# Patient Record
Sex: Male | Born: 1995 | Race: Black or African American | Hispanic: No | Marital: Single | Smoking: Former smoker
Health system: Southern US, Community
[De-identification: ages and names within clinical notes are randomized; demographics above are authoritative.]

## PROBLEM LIST (undated history)

## (undated) DIAGNOSIS — M419 Scoliosis, unspecified: Secondary | ICD-10-CM

## (undated) HISTORY — PX: BACK SURGERY: SHX140

---
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
## 2015-04-22 ENCOUNTER — Encounter (HOSPITAL_COMMUNITY): Payer: Self-pay | Admitting: *Deleted

## 2015-04-22 ENCOUNTER — Emergency Department (HOSPITAL_COMMUNITY)
Admission: EM | Admit: 2015-04-22 | Discharge: 2015-04-23 | Disposition: A | Discharge status: Other Acute Inpatient Hospital | Payer: Managed Care, Other (non HMO) | Attending: Emergency Medicine | Admitting: Emergency Medicine

## 2015-04-22 DIAGNOSIS — Z87891 Personal history of nicotine dependence: Secondary | ICD-10-CM | POA: Diagnosis not present

## 2015-04-22 DIAGNOSIS — Z8739 Personal history of other diseases of the musculoskeletal system and connective tissue: Secondary | ICD-10-CM | POA: Diagnosis not present

## 2015-04-22 DIAGNOSIS — R45851 Suicidal ideations: Secondary | ICD-10-CM

## 2015-04-22 DIAGNOSIS — F1210 Cannabis abuse, uncomplicated: Secondary | ICD-10-CM | POA: Insufficient documentation

## 2015-04-22 DIAGNOSIS — F32A Depression, unspecified: Secondary | ICD-10-CM

## 2015-04-22 DIAGNOSIS — F329 Major depressive disorder, single episode, unspecified: Secondary | ICD-10-CM | POA: Diagnosis not present

## 2015-04-22 LAB — CBC
HCT: 45.7 % (ref 39.0–52.0)
Hemoglobin: 15.6 g/dL (ref 13.0–17.0)
MCH: 29.7 pg (ref 26.0–34.0)
MCHC: 34.1 g/dL (ref 30.0–36.0)
MCV: 87 fL (ref 78.0–100.0)
PLATELETS: 145 10*3/uL — AB (ref 150–400)
RBC: 5.25 MIL/uL (ref 4.22–5.81)
RDW: 11.9 % (ref 11.5–15.5)
WBC: 2.6 10*3/uL — AB (ref 4.0–10.5)

## 2015-04-22 LAB — COMPREHENSIVE METABOLIC PANEL
ALBUMIN: 4.5 g/dL (ref 3.5–5.0)
ALK PHOS: 88 U/L (ref 38–126)
ALT: 17 U/L (ref 17–63)
AST: 26 U/L (ref 15–41)
Anion gap: 15 (ref 5–15)
BILIRUBIN TOTAL: 0.8 mg/dL (ref 0.3–1.2)
BUN: 15 mg/dL (ref 6–20)
CO2: 25 mmol/L (ref 22–32)
Calcium: 10.1 mg/dL (ref 8.9–10.3)
Chloride: 101 mmol/L (ref 101–111)
Creatinine, Ser: 1.15 mg/dL (ref 0.61–1.24)
GFR calc non Af Amer: >60 mL/min (ref >60)
Glucose, Bld: 85 mg/dL (ref 65–99)
Potassium: 3.8 mmol/L (ref 3.5–5.1)
SODIUM: 141 mmol/L (ref 135–145)
Total Protein: 8.4 g/dL — ABNORMAL HIGH (ref 6.5–8.1)

## 2015-04-22 LAB — RAPID URINE DRUG SCREEN, HOSP PERFORMED
AMPHETAMINES: NOT DETECTED
BENZODIAZEPINES: NOT DETECTED
Barbiturates: NOT DETECTED
COCAINE: NOT DETECTED
OPIATES: NOT DETECTED
Tetrahydrocannabinol: POSITIVE — AB

## 2015-04-22 LAB — ACETAMINOPHEN LEVEL

## 2015-04-22 LAB — SALICYLATE LEVEL

## 2015-04-22 LAB — ETHANOL
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
## 2015-04-22 NOTE — ED Notes (Signed)
Pt states that he has felt suicidal for several months. Denies plan. Reports hx with anxiety but does not take any meds. States he feels very nervous.

## 2015-04-22 NOTE — ED Provider Notes (Signed)
CSN: [id number]     Arrival date & time 04/22/15  1933 History   First MD Initiated Contact with Patient 04/22/15 2128     Chief Complaint  Patient presents with  . Suicidal   HPI Pt presented to the ED with complaints of depression and suicidal ideation.  Pt has been having trouble with depression for several months.  He has had several life stressors including losing his job.  He denies any drug or alcohol use.  He has been having thoughts of suicide although no specific plan.  Past Medical History  Diagnosis Date  . Scoliosis    Past Surgical History  Procedure Laterality Date  . Back surgery     No family history on file. Social History  Substance Use Topics  . Smoking status: Former Games developer  . Smokeless tobacco: Never Used  . Alcohol Use: No    Review of Systems  All other systems reviewed and are negative.     Allergies  Review of patient's allergies indicates no known allergies.  Home Medications   Prior to Admission medications   Not on File   BP 145/97 mmHg  Pulse 67  Temp(Src) 99 F (37.2 C) (Oral)  Resp 18  SpO2 98% Physical Exam  Constitutional: He appears well-developed and well-nourished. No distress.  HENT:  Head: Normocephalic and atraumatic.  Right Ear: External ear normal.  Left Ear: External ear normal.  Eyes: Conjunctivae are normal. Right eye exhibits no discharge. Left eye exhibits no discharge. No scleral icterus.  Neck: Neck supple. No tracheal deviation present.  Cardiovascular: Normal rate, regular rhythm and intact distal pulses.   Pulmonary/Chest: Effort normal and breath sounds normal. No stridor. No respiratory distress. He has no wheezes. He has no rales.  Abdominal: Soft. Bowel sounds are normal. He exhibits no distension. There is no tenderness. There is no rebound and no guarding.  Musculoskeletal: He exhibits no edema or tenderness.  Neurological: He is alert. He has normal strength. No cranial nerve deficit (no facial droop,  extraocular movements intact, no slurred speech) or sensory deficit. He exhibits normal muscle tone. He displays no seizure activity. Coordination normal.  Skin: Skin is warm and dry. No rash noted.  Psychiatric: He has a normal mood and affect.  Nursing note and vitals reviewed.   ED Course  Procedures (including critical care time) Labs Review Labs Reviewed  COMPREHENSIVE METABOLIC PANEL - Abnormal; Notable for the following:    Total Protein 8.4 (*)    All other components within normal limits  ACETAMINOPHEN LEVEL - Abnormal; Notable for the following:    Acetaminophen (Tylenol), Serum <10 (*)    All other components within normal limits  CBC - Abnormal; Notable for the following:    WBC 2.6 (*)    Platelets 145 (*)    All other components within normal limits  URINE RAPID DRUG SCREEN, HOSP PERFORMED - Abnormal; Notable for the following:    Tetrahydrocannabinol POSITIVE (*)    All other components within normal limits  ETHANOL  SALICYLATE LEVEL     MDM   Final diagnoses:  Suicidal ideation  Depression    Pt labs have been reviewed.  No concerning findings.  Pt is medically stable.   Will consult with psychiatry.    Linwood Dibbles, MD 04/22/15 [phone]

## 2015-04-23 ENCOUNTER — Encounter (HOSPITAL_COMMUNITY): Payer: Self-pay | Admitting: *Deleted

## 2015-04-23 ENCOUNTER — Observation Stay (HOSPITAL_COMMUNITY)
Admission: AD | Admit: 2015-04-23 | Discharge: 2015-04-23 | Disposition: A | Discharge status: Home / Self Care | Payer: Managed Care, Other (non HMO) | Source: Intra-hospital | Attending: Psychiatry | Admitting: Psychiatry

## 2015-04-23 DIAGNOSIS — M419 Scoliosis, unspecified: Secondary | ICD-10-CM | POA: Diagnosis not present

## 2015-04-23 DIAGNOSIS — F4325 Adjustment disorder with mixed disturbance of emotions and conduct: Principal | ICD-10-CM | POA: Insufficient documentation

## 2015-04-23 DIAGNOSIS — Z87891 Personal history of nicotine dependence: Secondary | ICD-10-CM | POA: Insufficient documentation

## 2015-04-23 DIAGNOSIS — R45851 Suicidal ideations: Secondary | ICD-10-CM | POA: Insufficient documentation

## 2015-04-23 DIAGNOSIS — F1290 Cannabis use, unspecified, uncomplicated: Secondary | ICD-10-CM | POA: Insufficient documentation

## 2015-04-23 DIAGNOSIS — F329 Major depressive disorder, single episode, unspecified: Secondary | ICD-10-CM | POA: Diagnosis present

## 2015-04-23 MED ORDER — MAGNESIUM HYDROXIDE 400 MG/5ML PO SUSP: 30.0000 mL | Freq: Every day | ORAL | Status: DC | PRN

## 2015-04-23 MED ORDER — ALUM & MAG HYDROXIDE-SIMETH 200-200-20 MG/5ML PO SUSP: 30.0000 mL | ORAL | Status: DC | PRN

## 2015-04-23 MED ORDER — HYDROXYZINE HCL 25 MG PO TABS: 25.0000 mg | ORAL_TABLET | Freq: Three times a day (TID) | ORAL | Status: DC | PRN

## 2015-04-23 MED ORDER — NICOTINE 21 MG/24HR TD PT24: 21.0000 mg | MEDICATED_PATCH | Freq: Every day | TRANSDERMAL | Status: DC | PRN

## 2015-04-23 MED ORDER — ACETAMINOPHEN 325 MG PO TABS: 650.0000 mg | ORAL_TABLET | Freq: Four times a day (QID) | ORAL | Status: DC | PRN

## 2015-04-23 NOTE — Consult Note (Deleted)
[facility] OBS UNIT H&P  Patient Identification: Troy Vasquez MRN:  [id number] Principal Diagnosis: Adjustment disorder with mixed disturbance of emotions and conduct Diagnosis:   Patient Active Problem List   Diagnosis Date Noted  . Adjustment disorder with mixed disturbance of emotions and conduct [F43.25] 04/23/2015    Total Time spent with patient: 45 minutes  Subjective:   Troy Vasquez is a 20 y.o. male patient admitted with reports of fleeting suicidal ideations over the past few days without plan or intent. Pt seen and chart reviewed. Pt is alert/oriented x4, calm, cooperative, and appropriate to situation. Pt denies suicidal/homicidal ideation and psychosis and does not appear to be responding to internal stimuli. Pt reports that he has no psych history and lives with his mother with whom he gets along well. Pt would like to discharge and follow-up with counseling services in the morning.   HPI:   Troy Vasquez is an 20 y.o. male. Pt states that prior to ED arrival, he was thinking about "everything" going on with him and "started freaking out"; Such as recently losing two jobs "back to back", current financial difficulties, and "things that happened to me as a child". Pt described "freaking out" as his emotions being "out of control". Pt stated that he kept crying and alternated between tearfulness and anger. Pt denies any h/o abuse or trauma. When assessing for intent pt responded "No not me personally but, I don't know like what would drive me to". Pt reports increased tearfulness, isolation/withdrawal, loss of interest, feelings of worthlessness/hopelessness and increased irritability. Pt reports poor appetite and decreased sleep, typically receiving 3hrs/night. Pt reports no h/o suicide attempts, aggression, HI/thoughts of harm, OPT, inpatient admission or self-injurious behaviors. Pt reports no h/o hallucinations. Pt was fully oriented. Pt presented as cooperative yet guarded.    Past Psychiatric History: None  Risk to Self:   Risk to Others:   Prior Inpatient Therapy:   Prior Outpatient Therapy:    Past Medical History:  Past Medical History  Diagnosis Date  . Scoliosis     Past Surgical History  Procedure Laterality Date  . Back surgery     Family History: History reviewed. No pertinent family history. Family Psychiatric  History: None Social History:  History  Alcohol Use No     History  Drug Use  . Yes  . Special: Marijuana    Comment: once/day    Social History   Social History  . Marital Status: Single    Spouse Name: N/A  . Number of Children: N/A  . Years of Education: N/A   Social History Main Topics  . Smoking status: Former Research scientist (life sciences)  . Smokeless tobacco: Never Used  . Alcohol Use: No  . Drug Use: Yes    Special: Marijuana     Comment: once/day  . Sexual Activity: Not Asked   Other Topics Concern  . None   Social History Narrative   Additional Social History:    Allergies:  No Known Allergies  Labs:  Results for orders placed or performed during the hospital encounter of 04/22/15 (from the past 48 hour(s))  Comprehensive metabolic panel     Status: Abnormal   Collection Time: 04/22/15  7:48 PM  Result Value Ref Range   Sodium 141 135 - 145 mmol/L   Potassium 3.8 3.5 - 5.1 mmol/L   Chloride 101 101 - 111 mmol/L   CO2 25 22 - 32 mmol/L   Glucose, Bld 85 65 - 99 mg/dL   BUN 15  6 - 20 mg/dL   Creatinine, Ser 1.15 0.61 - 1.24 mg/dL   Calcium 10.1 8.9 - 10.3 mg/dL   Total Protein 8.4 (H) 6.5 - 8.1 g/dL   Albumin 4.5 3.5 - 5.0 g/dL   AST 26 15 - 41 U/L   ALT 17 17 - 63 U/L   Alkaline Phosphatase 88 38 - 126 U/L   Total Bilirubin 0.8 0.3 - 1.2 mg/dL   GFR calc non Af Amer >60 >60 mL/min   GFR calc Af Amer >60 >60 mL/min    Comment: (NOTE) The eGFR has been calculated using the CKD EPI equation. This calculation has not been validated in all clinical situations. eGFR's persistently <60 mL/min signify possible  Chronic Kidney Disease.    Anion gap 15 5 - 15  Ethanol (ETOH)     Status: None   Collection Time: 04/22/15  7:48 PM  Result Value Ref Range   Alcohol, Ethyl (B) <5 <5 mg/dL    Comment:        LOWEST DETECTABLE LIMIT FOR SERUM ALCOHOL IS 5 mg/dL FOR MEDICAL PURPOSES ONLY   Salicylate level     Status: None   Collection Time: 04/22/15  7:48 PM  Result Value Ref Range   Salicylate Lvl <0.0 2.8 - 30.0 mg/dL  Acetaminophen level     Status: Abnormal   Collection Time: 04/22/15  7:48 PM  Result Value Ref Range   Acetaminophen (Tylenol), Serum <10 (L) 10 - 30 ug/mL    Comment:        THERAPEUTIC CONCENTRATIONS VARY SIGNIFICANTLY. A RANGE OF 10-30 ug/mL MAY BE AN EFFECTIVE CONCENTRATION FOR MANY PATIENTS. HOWEVER, SOME ARE BEST TREATED AT CONCENTRATIONS OUTSIDE THIS RANGE. ACETAMINOPHEN CONCENTRATIONS >150 ug/mL AT 4 HOURS AFTER INGESTION AND >50 ug/mL AT 12 HOURS AFTER INGESTION ARE OFTEN ASSOCIATED WITH TOXIC REACTIONS.   CBC     Status: Abnormal   Collection Time: 04/22/15  7:48 PM  Result Value Ref Range   WBC 2.6 (L) 4.0 - 10.5 K/uL   RBC 5.25 4.22 - 5.81 MIL/uL   Hemoglobin 15.6 13.0 - 17.0 g/dL   HCT 45.7 39.0 - 52.0 %   MCV 87.0 78.0 - 100.0 fL   MCH 29.7 26.0 - 34.0 pg   MCHC 34.1 30.0 - 36.0 g/dL   RDW 11.9 11.5 - 15.5 %   Platelets 145 (L) 150 - 400 K/uL  Urine rapid drug screen (hosp performed) (Not at [facility])     Status: Abnormal   Collection Time: 04/22/15  7:48 PM  Result Value Ref Range   Opiates NONE DETECTED NONE DETECTED   Cocaine NONE DETECTED NONE DETECTED   Benzodiazepines NONE DETECTED NONE DETECTED   Amphetamines NONE DETECTED NONE DETECTED   Tetrahydrocannabinol POSITIVE (A) NONE DETECTED   Barbiturates NONE DETECTED NONE DETECTED    Comment:        DRUG SCREEN FOR MEDICAL PURPOSES ONLY.  IF CONFIRMATION IS NEEDED FOR ANY PURPOSE, NOTIFY LAB WITHIN 5 DAYS.        LOWEST DETECTABLE LIMITS FOR URINE DRUG SCREEN Drug Class       Cutoff  (ng/mL) Amphetamine      1000 Barbiturate      200 Benzodiazepine   174 Tricyclics       944 Opiates          300 Cocaine          300 THC  50     No current facility-administered medications for this encounter.    Musculoskeletal: Strength & Muscle Tone: within normal limits Gait & Station: normal Patient leans: N/A  Psychiatric Specialty Exam: Review of Systems  Psychiatric/Behavioral: Positive for depression. Negative for substance abuse.  All other systems reviewed and are negative.   Blood pressure 110/68, pulse 66, temperature 98.4 F (36.9 C), temperature source Oral, resp. rate 16, height '5\' 9"'$  (1.753 m), weight 61.689 kg (136 lb), SpO2 100 %.Body mass index is 20.07 kg/(m^2).  General Appearance: Casual and Fairly Groomed  Engineer, water::  Good  Speech:  Clear and Coherent and Normal Rate  Volume:  Normal  Mood:  Anxious  Affect:  Appropriate and Congruent  Thought Process:  Coherent and Goal Directed  Orientation:  Full (Time, Place, and Person)  Thought Content:  concerns about followup  Suicidal Thoughts:  No  Homicidal Thoughts:  No  Memory:  Immediate;   Fair Recent;   Fair Remote;   Fair  Judgement:  Fair  Insight:  Fair  Psychomotor Activity:  Normal  Concentration:  Fair  Recall:  [employer] of Knowledge:Fair  Language: Fair  Akathisia:  No  Handed:    AIMS (if indicated):     Assets:  Communication Skills Desire for Improvement Physical Health Resilience Social Support  ADL's:  Intact  Cognition: WNL  Sleep:      Treatment Plan Summary: Adjustment disorder with mixed disturbance of emotions and conduct, improving, stable for discharge   Disposition: No evidence of imminent risk to self or others at present.   Patient does not meet criteria for psychiatric inpatient admission. Supportive therapy provided about ongoing stressors. Discussed crisis plan, support from social network, calling 911, coming to the Emergency  Department, and calling Suicide Hotline. Discharge home with outpatient resources given for psychiatry counseling services  Benjamine Mola, [place] 04/23/2015 3:02 PM

## 2015-04-23 NOTE — BH Assessment (Addendum)
Tele Assessment Note   Troy Vasquez is an 20 y.o. male. Pt states that prior to ED arrival, he was thinking about "everything" going on with him and "started freaking out"; Such as recently losing two jobs "back to back", current financial difficulties, and "things that happened to me as a child". Pt described "freaking out" as his emotions being "out of control". Pt stated that he kept crying and alternated between tearfulness and anger.  Pt denies any h/o abuse or trauma. When assessing for intent pt responded "No not me personally but, I don't know like what would drive me to".  Pt reports increased tearfulness, isolation/withdrawal, loss of interest, feelings of worthlessness/hopelessness and increased irritability. Pt reports poor appetite and decreased sleep, typically receiving 3hrs/night.   Pt reports no h/o suicide attempts, aggression, HI/thoughts of harm, OPT, inpatient admission or self-injurious behaviors. Pt reports no h/o hallucinations.   Pt was fully oriented. Pt presented as cooperative yet guarded.   Diagnosis: Unspecified Depressive D/O  Past Medical History:  Past Medical History  Diagnosis Date  . Scoliosis     Past Surgical History  Procedure Laterality Date  . Back surgery      Family History: No family history on file.  Social History:  reports that he has quit smoking. He has never used smokeless tobacco. He reports that he uses illicit drugs (Marijuana). He reports that he does not drink alcohol.  Additional Social History:  Alcohol / Drug Use Pain Medications: None Reported  Prescriptions: None Reported  Over the Counter: None Reported  History of alcohol / drug use?: No history of alcohol / drug abuse  CIWA: CIWA-Ar BP: 122/74 mmHg Pulse Rate: 86 COWS:    PATIENT STRENGTHS: (choose at least two) Average or above average intelligence Communication skills Physical Health  Allergies: No Known Allergies  Home Medications:  (Not in a  hospital admission)  OB/GYN Status:  No LMP for male patient.  General Assessment Data Location of Assessment: [facility] ED TTS Assessment: In system Is this a Tele or Face-to-Face Assessment?: Face-to-Face Is this an Initial Assessment or a Re-assessment for this encounter?: Initial Assessment Marital status: Single Is patient pregnant?: No Pregnancy Status: No Living Arrangements: Parent Can pt return to current living arrangement?: Yes Admission Status: Voluntary Is patient capable of signing voluntary admission?: Yes Referral Source: Self/Family/Friend Insurance type: None     Crisis Care Plan Living Arrangements: Parent Name of Psychiatrist: None Name of Therapist: None  Education Status Is patient currently in school?: No Highest grade of school patient has completed: 12th  Risk to self with the past 6 months Suicidal Ideation: Yes-Currently Present Has patient been a risk to self within the past 6 months prior to admission? : No Suicidal Intent:  ("No not me personally but, I don't know like what would driv) Has patient had any suicidal intent within the past 6 months prior to admission? : No Is patient at risk for suicide?: Yes Suicidal Plan?: No Has patient had any suicidal plan within the past 6 months prior to admission? : No What has been your use of drugs/alcohol within the last 12 months?: Pt denies Previous Attempts/Gestures: No Other Self Harm Risks: None Reported Intentional Self Injurious Behavior: None Family Suicide History: No Recent stressful life event(s): Financial Problems, Other (Comment) (Job loss, "things that happened to me as a child. ) Depression: Yes Depression Symptoms: Tearfulness, Feeling angry/irritable, Loss of interest in usual pleasures, Feeling worthless/self pity, Isolating Substance abuse history and/or treatment for  substance abuse?: No Suicide prevention information given to non-admitted patients: Not applicable  Risk to Others  within the past 6 months Homicidal Ideation: No Does patient have any lifetime risk of violence toward others beyond the six months prior to admission? : No Thoughts of Harm to Others: No Current Homicidal Plan: No Access to Homicidal Means: No History of harm to others?: No Assessment of Violence: None Noted Does patient have access to weapons?: No Criminal Charges Pending?: No Does patient have a court date: No Is patient on probation?: No  Psychosis Hallucinations: None noted Delusions: None noted  Mental Status Report Appearance/Hygiene: In scrubs Eye Contact: Good Motor Activity: Unremarkable Speech: Logical/coherent Level of Consciousness: Quiet/awake Mood: Depressed Affect: Depressed Anxiety Level: None Thought Processes: Coherent, Relevant Judgement: Unimpaired Orientation: Person, Place, Time, Situation, Appropriate for developmental age Obsessive Compulsive Thoughts/Behaviors: None  Cognitive Functioning Concentration: Normal Memory: Recent Intact, Remote Intact IQ: Average Insight: Fair Impulse Control: Good Appetite: Poor Weight Loss: 4.5 Weight Gain: 0 Sleep: Decreased Total Hours of Sleep: 3 Vegetative Symptoms: Staying in bed  ADLScreening [facility] Assessment Services) Patient's cognitive ability adequate to safely complete daily activities?: Yes Patient able to express need for assistance with ADLs?: Yes Independently performs ADLs?: Yes (appropriate for developmental age)  Prior Inpatient Therapy Prior Inpatient Therapy: No  Prior Outpatient Therapy Prior Outpatient Therapy: No Does patient have an ACCT team?: No Does patient have Intensive In-House Services?  : No Does patient have [facility] services? : No Does patient have P4CC services?: No  ADL Screening (condition at time of admission) Patient's cognitive ability adequate to safely complete daily activities?: Yes Is the patient deaf or have difficulty hearing?: Yes (reports difficulty  hearing out of right ear at times) Does the patient have difficulty seeing, even when wearing glasses/contacts?: No Does the patient have difficulty concentrating, remembering, or making decisions?: Yes Patient able to express need for assistance with ADLs?: Yes Does the patient have difficulty dressing or bathing?: No Independently performs ADLs?: Yes (appropriate for developmental age) Does the patient have difficulty walking or climbing stairs?: No Weakness of Legs: None Weakness of Arms/Hands: None  Home Assistive Devices/Equipment Home Assistive Devices/Equipment: None  Therapy Consults (therapy consults require a physician order) PT Evaluation Needed: No OT Evalulation Needed: No SLP Evaluation Needed: No Abuse/Neglect Assessment (Assessment to be complete while patient is alone) Physical Abuse: Denies Verbal Abuse: Yes, past (Comment) Sexual Abuse: Denies Exploitation of patient/patient's resources: Denies Self-Neglect: Denies Values / Beliefs Cultural Requests During Hospitalization: None Spiritual Requests During Hospitalization: None Consults Spiritual Care Consult Needed: No Social Work Consult Needed: No Merchant navy officer (For Healthcare) Does patient have an advance directive?: No Would patient like information on creating an advanced directive?: Yes English as a second language teacher given    Additional Information 1:1 In Past 12 Months?: No CIRT Risk: No Elopement Risk: No Does patient have medical clearance?: Yes     Disposition: Per Donell Sievert, PA pt meets criteria for inpatient admission. Clinician verified lack of bed availability at [facility]. Clinician confirmed [facility] bed availability with [facility] Unit Charge RN, [place] and pt referral has been made. Bobby (pt RN) and EDP Dr.Delo have been informed of pt disposition.  Disposition Initial Assessment Completed for this Encounter: Yes Disposition of Patient: Inpatient treatment program Type of inpatient treatment  program: Adult  Rayel Santizo J [place] 04/23/2015 2:25 AM

## 2015-04-23 NOTE — Discharge Summary (Signed)
[facility] OBS UNIT DISCHARGE SUMMARY (pt seen once due to short LOS, does not want to stay in the [place] and feels he did not truly understand why he was here when he wants outpatient; no psych history)  Patient Identification: Troy Vasquez MRN:  [id number] Principal Diagnosis: Adjustment disorder with mixed disturbance of emotions and conduct Diagnosis:   Patient Active Problem List   Diagnosis Date Noted  . Adjustment disorder with mixed disturbance of emotions and conduct [F43.25] 04/23/2015    Total Time spent with patient: 45 minutes  Subjective:   Troy Vasquez is a 20 y.o. male patient admitted with reports of fleeting suicidal ideations over the past few days without plan or intent. Pt seen and chart reviewed. Pt is alert/oriented x4, calm, cooperative, and appropriate to situation. Pt denies suicidal/homicidal ideation and psychosis and does not appear to be responding to internal stimuli. Pt reports that he has no psych history and lives with his mother with whom he gets along well. Pt would like to discharge and follow-up with counseling services in the morning.   HPI:   Troy Vasquez is an 20 y.o. male. Pt states that prior to ED arrival, he was thinking about "everything" going on with him and "started freaking out"; Such as recently losing two jobs "back to back", current financial difficulties, and "things that happened to me as a child". Pt described "freaking out" as his emotions being "out of control". Pt stated that he kept crying and alternated between tearfulness and anger. Pt denies any h/o abuse or trauma. When assessing for intent pt responded "No not me personally but, I don't know like what would drive me to". Pt reports increased tearfulness, isolation/withdrawal, loss of interest, feelings of worthlessness/hopelessness and increased irritability. Pt reports poor appetite and decreased sleep, typically receiving 3hrs/night. Pt reports no h/o suicide attempts,  aggression, HI/thoughts of harm, OPT, inpatient admission or self-injurious behaviors. Pt reports no h/o hallucinations. Pt was fully oriented. Pt presented as cooperative yet guarded.   Past Psychiatric History: None  Risk to Self:   Risk to Others:   Prior Inpatient Therapy:   Prior Outpatient Therapy:    Past Medical History:  Past Medical History  Diagnosis Date  . Scoliosis     Past Surgical History  Procedure Laterality Date  . Back surgery     Family History: History reviewed. No pertinent family history. Family Psychiatric  History: None Social History:  History  Alcohol Use No     History  Drug Use  . Yes  . Special: Marijuana    Comment: once/day    Social History   Social History  . Marital Status: Single    Spouse Name: N/A  . Number of Children: N/A  . Years of Education: N/A   Social History Main Topics  . Smoking status: Former Research scientist (life sciences)  . Smokeless tobacco: Never Used  . Alcohol Use: No  . Drug Use: Yes    Special: Marijuana     Comment: once/day  . Sexual Activity: Not Asked   Other Topics Concern  . None   Social History Narrative   Additional Social History:    Allergies:  No Known Allergies  Labs:  Results for orders placed or performed during the hospital encounter of 04/22/15 (from the past 48 hour(s))  Comprehensive metabolic panel     Status: Abnormal   Collection Time: 04/22/15  7:48 PM  Result Value Ref Range   Sodium 141 135 - 145 mmol/L  Potassium 3.8 3.5 - 5.1 mmol/L   Chloride 101 101 - 111 mmol/L   CO2 25 22 - 32 mmol/L   Glucose, Bld 85 65 - 99 mg/dL   BUN 15 6 - 20 mg/dL   Creatinine, Ser 1.15 0.61 - 1.24 mg/dL   Calcium 10.1 8.9 - 10.3 mg/dL   Total Protein 8.4 (H) 6.5 - 8.1 g/dL   Albumin 4.5 3.5 - 5.0 g/dL   AST 26 15 - 41 U/L   ALT 17 17 - 63 U/L   Alkaline Phosphatase 88 38 - 126 U/L   Total Bilirubin 0.8 0.3 - 1.2 mg/dL   GFR calc non Af Amer >60 >60 mL/min   GFR calc Af Amer >60 >60 mL/min    Comment:  (NOTE) The eGFR has been calculated using the CKD EPI equation. This calculation has not been validated in all clinical situations. eGFR's persistently <60 mL/min signify possible Chronic Kidney Disease.    Anion gap 15 5 - 15  Ethanol (ETOH)     Status: None   Collection Time: 04/22/15  7:48 PM  Result Value Ref Range   Alcohol, Ethyl (B) <5 <5 mg/dL    Comment:        LOWEST DETECTABLE LIMIT FOR SERUM ALCOHOL IS 5 mg/dL FOR MEDICAL PURPOSES ONLY   Salicylate level     Status: None   Collection Time: 04/22/15  7:48 PM  Result Value Ref Range   Salicylate Lvl <3.3 2.8 - 30.0 mg/dL  Acetaminophen level     Status: Abnormal   Collection Time: 04/22/15  7:48 PM  Result Value Ref Range   Acetaminophen (Tylenol), Serum <10 (L) 10 - 30 ug/mL    Comment:        THERAPEUTIC CONCENTRATIONS VARY SIGNIFICANTLY. A RANGE OF 10-30 ug/mL MAY BE AN EFFECTIVE CONCENTRATION FOR MANY PATIENTS. HOWEVER, SOME ARE BEST TREATED AT CONCENTRATIONS OUTSIDE THIS RANGE. ACETAMINOPHEN CONCENTRATIONS >150 ug/mL AT 4 HOURS AFTER INGESTION AND >50 ug/mL AT 12 HOURS AFTER INGESTION ARE OFTEN ASSOCIATED WITH TOXIC REACTIONS.   CBC     Status: Abnormal   Collection Time: 04/22/15  7:48 PM  Result Value Ref Range   WBC 2.6 (L) 4.0 - 10.5 K/uL   RBC 5.25 4.22 - 5.81 MIL/uL   Hemoglobin 15.6 13.0 - 17.0 g/dL   HCT 45.7 39.0 - 52.0 %   MCV 87.0 78.0 - 100.0 fL   MCH 29.7 26.0 - 34.0 pg   MCHC 34.1 30.0 - 36.0 g/dL   RDW 11.9 11.5 - 15.5 %   Platelets 145 (L) 150 - 400 K/uL  Urine rapid drug screen (hosp performed) (Not at [facility])     Status: Abnormal   Collection Time: 04/22/15  7:48 PM  Result Value Ref Range   Opiates NONE DETECTED NONE DETECTED   Cocaine NONE DETECTED NONE DETECTED   Benzodiazepines NONE DETECTED NONE DETECTED   Amphetamines NONE DETECTED NONE DETECTED   Tetrahydrocannabinol POSITIVE (A) NONE DETECTED   Barbiturates NONE DETECTED NONE DETECTED    Comment:        DRUG SCREEN  FOR MEDICAL PURPOSES ONLY.  IF CONFIRMATION IS NEEDED FOR ANY PURPOSE, NOTIFY LAB WITHIN 5 DAYS.        LOWEST DETECTABLE LIMITS FOR URINE DRUG SCREEN Drug Class       Cutoff (ng/mL) Amphetamine      1000 Barbiturate      200 Benzodiazepine   007 Tricyclics       622 Opiates  300 Cocaine          300 THC              50     No current facility-administered medications for this encounter.    Musculoskeletal: Strength & Muscle Tone: within normal limits Gait & Station: normal Patient leans: N/A  Psychiatric Specialty Exam: Review of Systems  Psychiatric/Behavioral: Positive for depression. Negative for substance abuse.  All other systems reviewed and are negative.   Blood pressure 110/68, pulse 66, temperature 98.4 F (36.9 C), temperature source Oral, resp. rate 16, height 5' 9"  (1.753 m), weight 61.689 kg (136 lb), SpO2 100 %.Body mass index is 20.07 kg/(m^2).  General Appearance: Casual and Fairly Groomed  Engineer, water::  Good  Speech:  Clear and Coherent and Normal Rate  Volume:  Normal  Mood:  Anxious  Affect:  Appropriate and Congruent  Thought Process:  Coherent and Goal Directed  Orientation:  Full (Time, Place, and Person)  Thought Content:  concerns about followup  Suicidal Thoughts:  No  Homicidal Thoughts:  No  Memory:  Immediate;   Fair Recent;   Fair Remote;   Fair  Judgement:  Fair  Insight:  Fair  Psychomotor Activity:  Normal  Concentration:  Fair  Recall:  [employer] of Knowledge:Fair  Language: Fair  Akathisia:  No  Handed:    AIMS (if indicated):     Assets:  Communication Skills Desire for Improvement Physical Health Resilience Social Support  ADL's:  Intact  Cognition: WNL  Sleep:      Treatment Plan Summary: Adjustment disorder with mixed disturbance of emotions and conduct, improving, stable for discharge    Disposition: No evidence of imminent risk to self or others at present.   Patient does not meet criteria for  psychiatric inpatient admission. Supportive therapy provided about ongoing stressors. Discussed crisis plan, support from social network, calling 911, coming to the Emergency Department, and calling Suicide Hotline. Discharge home with outpatient resources given for psychiatry counseling services  Benjamine Mola, [place] 04/23/2015 3:02 PM  I agree with assessment and plan Geralyn Flash A. Sabra Heck, M.D.

## 2015-04-23 NOTE — ED Notes (Signed)
pts mother took home all pt belongings.

## 2015-04-23 NOTE — Progress Notes (Signed)
Patient ID: Troy Vasquez, male   DOB: 1996/01/03, 20 y.o.   MRN: [id number] Patient has all f/u resources given by counselor.  He denies SI/HI and his behavior is appropriate.  Verbalizes all f/u instructions.  No belongings to return to patient. Escorted to lobby to wait for family to pick up. NAD

## 2015-04-23 NOTE — Progress Notes (Signed)
Troy Vasquez is a 20 y/o S/M admitted to voluntarily to observation unit following c/o depression and passive SI thoughts.  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  He reports feelings of worthlessness,hoplessness and a decreased appetite with subsequent 5lb weight loss.  Denies physical problems or pain. States he has supportive parents and siblings.  Is requesting outside counseling.  No drug/etoh use.  No prescribed home meds.  Seasonal allergies only.  Oriented to obs unit and report to providers.  Meal given.

## 2015-04-23 NOTE — ED Notes (Signed)
Mother -- Andrei Mccook -- [phone] --- notified of patient's transfer.

## 2015-04-23 NOTE — Progress Notes (Signed)
Discussed pt's case with psych team. Dr. Lolly Mustache has accepted pt to [facility] observation unit bed 2. Report can be called at [phone].  Ilean Skill, MSW, LCSW Clinical Social Work, Disposition  04/23/2015 [phone]

## 2015-04-23 NOTE — ED Notes (Signed)
Patient was given a snack and drink. A regular diet ordered for lunch. 

## 2015-04-23 NOTE — ED Notes (Signed)
Pt provided with a [place] sandwich and sprite to drink.

## 2015-04-23 NOTE — H&P (Signed)
[facility] OBS UNIT H&P  Patient Identification: Troy Vasquez MRN:  [id number] Principal Diagnosis: Adjustment disorder with mixed disturbance of emotions and conduct Diagnosis:   Patient Active Problem List   Diagnosis Date Noted  . Adjustment disorder with mixed disturbance of emotions and conduct [F43.25] 04/23/2015    Total Time spent with patient: 45 minutes  Subjective:   Troy Vasquez is a 20 y.o. male patient admitted with reports of fleeting suicidal ideations over the past few days without plan or intent. Pt seen and chart reviewed. Pt is alert/oriented x4, calm, cooperative, and appropriate to situation. Pt denies suicidal/homicidal ideation and psychosis and does not appear to be responding to internal stimuli. Pt reports that he has no psych history and lives with his mother with whom he gets along well. Pt would like to discharge and follow-up with counseling services in the morning.   HPI:   Troy Vasquez is an 20 y.o. male. Pt states that prior to ED arrival, he was thinking about "everything" going on with him and "started freaking out"; Such as recently losing two jobs "back to back", current financial difficulties, and "things that happened to me as a child". Pt described "freaking out" as his emotions being "out of control". Pt stated that he kept crying and alternated between tearfulness and anger. Pt denies any h/o abuse or trauma. When assessing for intent pt responded "No not me personally but, I don't know like what would drive me to". Pt reports increased tearfulness, isolation/withdrawal, loss of interest, feelings of worthlessness/hopelessness and increased irritability. Pt reports poor appetite and decreased sleep, typically receiving 3hrs/night. Pt reports no h/o suicide attempts, aggression, HI/thoughts of harm, OPT, inpatient admission or self-injurious behaviors. Pt reports no h/o hallucinations. Pt was fully oriented. Pt presented as cooperative yet guarded.    Past Psychiatric History: None  Risk to Self:   Risk to Others:   Prior Inpatient Therapy:   Prior Outpatient Therapy:    Past Medical History:  Past Medical History  Diagnosis Date  . Scoliosis     Past Surgical History  Procedure Laterality Date  . Back surgery     Family History: History reviewed. No pertinent family history. Family Psychiatric  History: None Social History:  History  Alcohol Use No     History  Drug Use  . Yes  . Special: Marijuana    Comment: once/day    Social History   Social History  . Marital Status: Single    Spouse Name: N/A  . Number of Children: N/A  . Years of Education: N/A   Social History Main Topics  . Smoking status: Former Research scientist (life sciences)  . Smokeless tobacco: Never Used  . Alcohol Use: No  . Drug Use: Yes    Special: Marijuana     Comment: once/day  . Sexual Activity: Not Asked   Other Topics Concern  . None   Social History Narrative   Additional Social History:    Allergies:  No Known Allergies  Labs:  Results for orders placed or performed during the hospital encounter of 04/22/15 (from the past 48 hour(s))  Comprehensive metabolic panel     Status: Abnormal   Collection Time: 04/22/15  7:48 PM  Result Value Ref Range   Sodium 141 135 - 145 mmol/L   Potassium 3.8 3.5 - 5.1 mmol/L   Chloride 101 101 - 111 mmol/L   CO2 25 22 - 32 mmol/L   Glucose, Bld 85 65 - 99 mg/dL   BUN 15  6 - 20 mg/dL   Creatinine, Ser 1.15 0.61 - 1.24 mg/dL   Calcium 10.1 8.9 - 10.3 mg/dL   Total Protein 8.4 (H) 6.5 - 8.1 g/dL   Albumin 4.5 3.5 - 5.0 g/dL   AST 26 15 - 41 U/L   ALT 17 17 - 63 U/L   Alkaline Phosphatase 88 38 - 126 U/L   Total Bilirubin 0.8 0.3 - 1.2 mg/dL   GFR calc non Af Amer >60 >60 mL/min   GFR calc Af Amer >60 >60 mL/min    Comment: (NOTE) The eGFR has been calculated using the CKD EPI equation. This calculation has not been validated in all clinical situations. eGFR's persistently <60 mL/min signify possible  Chronic Kidney Disease.    Anion gap 15 5 - 15  Ethanol (ETOH)     Status: None   Collection Time: 04/22/15  7:48 PM  Result Value Ref Range   Alcohol, Ethyl (B) <5 <5 mg/dL    Comment:        LOWEST DETECTABLE LIMIT FOR SERUM ALCOHOL IS 5 mg/dL FOR MEDICAL PURPOSES ONLY   Salicylate level     Status: None   Collection Time: 04/22/15  7:48 PM  Result Value Ref Range   Salicylate Lvl <0.0 2.8 - 30.0 mg/dL  Acetaminophen level     Status: Abnormal   Collection Time: 04/22/15  7:48 PM  Result Value Ref Range   Acetaminophen (Tylenol), Serum <10 (L) 10 - 30 ug/mL    Comment:        THERAPEUTIC CONCENTRATIONS VARY SIGNIFICANTLY. A RANGE OF 10-30 ug/mL MAY BE AN EFFECTIVE CONCENTRATION FOR MANY PATIENTS. HOWEVER, SOME ARE BEST TREATED AT CONCENTRATIONS OUTSIDE THIS RANGE. ACETAMINOPHEN CONCENTRATIONS >150 ug/mL AT 4 HOURS AFTER INGESTION AND >50 ug/mL AT 12 HOURS AFTER INGESTION ARE OFTEN ASSOCIATED WITH TOXIC REACTIONS.   CBC     Status: Abnormal   Collection Time: 04/22/15  7:48 PM  Result Value Ref Range   WBC 2.6 (L) 4.0 - 10.5 K/uL   RBC 5.25 4.22 - 5.81 MIL/uL   Hemoglobin 15.6 13.0 - 17.0 g/dL   HCT 45.7 39.0 - 52.0 %   MCV 87.0 78.0 - 100.0 fL   MCH 29.7 26.0 - 34.0 pg   MCHC 34.1 30.0 - 36.0 g/dL   RDW 11.9 11.5 - 15.5 %   Platelets 145 (L) 150 - 400 K/uL  Urine rapid drug screen (hosp performed) (Not at [facility])     Status: Abnormal   Collection Time: 04/22/15  7:48 PM  Result Value Ref Range   Opiates NONE DETECTED NONE DETECTED   Cocaine NONE DETECTED NONE DETECTED   Benzodiazepines NONE DETECTED NONE DETECTED   Amphetamines NONE DETECTED NONE DETECTED   Tetrahydrocannabinol POSITIVE (A) NONE DETECTED   Barbiturates NONE DETECTED NONE DETECTED    Comment:        DRUG SCREEN FOR MEDICAL PURPOSES ONLY.  IF CONFIRMATION IS NEEDED FOR ANY PURPOSE, NOTIFY LAB WITHIN 5 DAYS.        LOWEST DETECTABLE LIMITS FOR URINE DRUG SCREEN Drug Class       Cutoff  (ng/mL) Amphetamine      1000 Barbiturate      200 Benzodiazepine   174 Tricyclics       944 Opiates          300 Cocaine          300 THC  50     No current facility-administered medications for this encounter.    Musculoskeletal: Strength & Muscle Tone: within normal limits Gait & Station: normal Patient leans: N/A  Psychiatric Specialty Exam: Review of Systems  Psychiatric/Behavioral: Positive for depression. Negative for substance abuse.  All other systems reviewed and are negative.   Blood pressure 110/68, pulse 66, temperature 98.4 F (36.9 C), temperature source Oral, resp. rate 16, height 5' 9" (1.753 m), weight 61.689 kg (136 lb), SpO2 100 %.Body mass index is 20.07 kg/(m^2).  General Appearance: Casual and Fairly Groomed  Engineer, water::  Good  Speech:  Clear and Coherent and Normal Rate  Volume:  Normal  Mood:  Anxious  Affect:  Appropriate and Congruent  Thought Process:  Coherent and Goal Directed  Orientation:  Full (Time, Place, and Person)  Thought Content:  concerns about followup  Suicidal Thoughts:  No  Homicidal Thoughts:  No  Memory:  Immediate;   Fair Recent;   Fair Remote;   Fair  Judgement:  Fair  Insight:  Fair  Psychomotor Activity:  Normal  Concentration:  Fair  Recall:  [employer] of Knowledge:Fair  Language: Fair  Akathisia:  No  Handed:    AIMS (if indicated):     Assets:  Communication Skills Desire for Improvement Physical Health Resilience Social Support  ADL's:  Intact  Cognition: WNL  Sleep:      Treatment Plan Summary: Adjustment disorder with mixed disturbance of emotions and conduct, improving, stable for discharge   Disposition: No evidence of imminent risk to self or others at present.   Patient does not meet criteria for psychiatric inpatient admission. Supportive therapy provided about ongoing stressors. Discussed crisis plan, support from social network, calling 911, coming to the Emergency  Department, and calling Suicide Hotline. Discharge home with outpatient resources given for psychiatry counseling services  Benjamine Mola, [place] 04/23/2015 3:02 PM  I agree with assessment and plan Geralyn Flash A. Sabra Heck, M.D.
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
## 2015-11-07 ENCOUNTER — Emergency Department (HOSPITAL_COMMUNITY): Payer: 59

## 2015-11-07 ENCOUNTER — Emergency Department (HOSPITAL_COMMUNITY)
Admission: EM | Admit: 2015-11-07 | Discharge: 2015-11-07 | Disposition: A | Discharge status: Home / Self Care | Payer: 59 | Attending: Emergency Medicine | Admitting: Emergency Medicine

## 2015-11-07 ENCOUNTER — Encounter (HOSPITAL_COMMUNITY): Payer: Self-pay | Admitting: Emergency Medicine

## 2015-11-07 DIAGNOSIS — S0990XA Unspecified injury of head, initial encounter: Secondary | ICD-10-CM | POA: Diagnosis present

## 2015-11-07 DIAGNOSIS — Y939 Activity, unspecified: Secondary | ICD-10-CM | POA: Insufficient documentation

## 2015-11-07 DIAGNOSIS — Z5181 Encounter for therapeutic drug level monitoring: Secondary | ICD-10-CM | POA: Diagnosis not present

## 2015-11-07 DIAGNOSIS — Y999 Unspecified external cause status: Secondary | ICD-10-CM | POA: Diagnosis not present

## 2015-11-07 DIAGNOSIS — W1830XA Fall on same level, unspecified, initial encounter: Secondary | ICD-10-CM | POA: Insufficient documentation

## 2015-11-07 DIAGNOSIS — S0001XA Abrasion of scalp, initial encounter: Secondary | ICD-10-CM | POA: Insufficient documentation

## 2015-11-07 DIAGNOSIS — F10120 Alcohol abuse with intoxication, uncomplicated: Secondary | ICD-10-CM | POA: Diagnosis not present

## 2015-11-07 DIAGNOSIS — Z23 Encounter for immunization: Secondary | ICD-10-CM | POA: Insufficient documentation

## 2015-11-07 DIAGNOSIS — Y9289 Other specified places as the place of occurrence of the external cause: Secondary | ICD-10-CM | POA: Insufficient documentation

## 2015-11-07 DIAGNOSIS — F10920 Alcohol use, unspecified with intoxication, uncomplicated: Secondary | ICD-10-CM

## 2015-11-07 DIAGNOSIS — Z87891 Personal history of nicotine dependence: Secondary | ICD-10-CM | POA: Insufficient documentation

## 2015-11-07 LAB — RAPID URINE DRUG SCREEN, HOSP PERFORMED
Amphetamines: NOT DETECTED
BARBITURATES: NOT DETECTED
Benzodiazepines: NOT DETECTED
COCAINE: NOT DETECTED
OPIATES: NOT DETECTED
Tetrahydrocannabinol: POSITIVE — AB

## 2015-11-07 LAB — CBC
HCT: 42.4 % (ref 39.0–52.0)
Hemoglobin: 13.9 g/dL (ref 13.0–17.0)
MCH: 29 pg (ref 26.0–34.0)
MCHC: 32.8 g/dL (ref 30.0–36.0)
MCV: 88.3 fL (ref 78.0–100.0)
PLATELETS: 173 10*3/uL (ref 150–400)
RBC: 4.8 MIL/uL (ref 4.22–5.81)
RDW: 12.2 % (ref 11.5–15.5)
WBC: 3.8 10*3/uL — AB (ref 4.0–10.5)

## 2015-11-07 LAB — BASIC METABOLIC PANEL
ANION GAP: 12 (ref 5–15)
BUN: 13 mg/dL (ref 6–20)
CALCIUM: 9.6 mg/dL (ref 8.9–10.3)
CO2: 22 mmol/L (ref 22–32)
CREATININE: 1.03 mg/dL (ref 0.61–1.24)
Chloride: 106 mmol/L (ref 101–111)
Glucose, Bld: 96 mg/dL (ref 65–99)
Potassium: 3.5 mmol/L (ref 3.5–5.1)
Sodium: 140 mmol/L (ref 135–145)

## 2015-11-07 LAB — ETHANOL: Alcohol, Ethyl (B): 192 mg/dL — ABNORMAL HIGH (ref <5)

## 2015-11-07 MED ORDER — TETANUS-DIPHTH-ACELL PERTUSSIS 5-2.5-18.5 LF-MCG/0.5 IM SUSP
0.5000 mL | Freq: Once | INTRAMUSCULAR | Status: AC
  Administered 2015-11-07: 0.5 mL via INTRAMUSCULAR
  Filled 2015-11-07: qty 0.5
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
## 2015-11-07 NOTE — Discharge Instructions (Signed)
Avoid excessive alcohol use. Follow up as needed. Tylenol or motrin for headache. Return if worsening symptoms.

## 2015-11-07 NOTE — ED Notes (Signed)
Patient not currently in room. Patient in CT at this moment.
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
## 2015-11-07 NOTE — ED Notes (Signed)
Pt came to registration desk and said he was leaving and walked out.  Brother spoke with RN and states pt is leaving.  Pt intoxicated. Strongly encouraged them to stay and informed them that pt would go to treatment room soon.  Brother talking with pt and pt has come back in to waiting room.  Encouraged to stay to be seen.

## 2015-11-07 NOTE — ED Notes (Signed)
Pt A&O x 4, VSS, NAD noted. Able to ambulate w/o difficulty.

## 2016-05-31 ENCOUNTER — Telehealth: Payer: Self-pay | Admitting: General Practice

## 2016-05-31 NOTE — Telephone Encounter (Signed)
Sorry but I am too full  

## 2016-05-31 NOTE — Telephone Encounter (Signed)
Pts mother would like to see if you would accept this pt since you see her other sons?

## 2016-05-31 NOTE — Telephone Encounter (Signed)
lmom for pts mother to give office a call back.

## 2016-06-01 NOTE — Telephone Encounter (Signed)
Mom is aware Dr Clent Ridges can not accept her son. She declined another provider at this location.

## 2020-03-24 ENCOUNTER — Emergency Department (HOSPITAL_COMMUNITY)
Admission: EM | Admit: 2020-03-24 | Discharge: 2020-03-24 | Disposition: A | Discharge status: Home / Self Care | Payer: 59 | Attending: Emergency Medicine | Admitting: Emergency Medicine

## 2020-03-24 ENCOUNTER — Emergency Department (HOSPITAL_COMMUNITY): Payer: 59

## 2020-03-24 ENCOUNTER — Other Ambulatory Visit: Payer: Self-pay

## 2020-03-24 ENCOUNTER — Encounter (HOSPITAL_COMMUNITY): Payer: Self-pay | Admitting: Emergency Medicine

## 2020-03-24 DIAGNOSIS — M542 Cervicalgia: Secondary | ICD-10-CM | POA: Diagnosis not present

## 2020-03-24 DIAGNOSIS — Y92410 Unspecified street and highway as the place of occurrence of the external cause: Secondary | ICD-10-CM | POA: Insufficient documentation

## 2020-03-24 DIAGNOSIS — R519 Headache, unspecified: Secondary | ICD-10-CM | POA: Diagnosis not present

## 2020-03-24 MED ORDER — IBUPROFEN 400 MG PO TABS
600.0000 mg | ORAL_TABLET | Freq: Once | ORAL | Status: AC
  Administered 2020-03-24: 600 mg via ORAL
  Filled 2020-03-24: qty 1

## 2020-03-24 NOTE — Discharge Instructions (Addendum)
Please return for problem.   Use ibuprofen (600mg  every 8 hours) for pain.
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
## 2020-03-24 NOTE — ED Notes (Signed)
Patient discharge instructions reviewed with the patient. The patient verbalized understanding of instructions. Patient discharged. 

## 2023-01-02 ENCOUNTER — Emergency Department (HOSPITAL_COMMUNITY): Payer: 59

## 2023-01-02 ENCOUNTER — Other Ambulatory Visit: Payer: Self-pay

## 2023-01-02 ENCOUNTER — Encounter (HOSPITAL_COMMUNITY): Payer: Self-pay

## 2023-01-02 ENCOUNTER — Emergency Department (HOSPITAL_COMMUNITY)
Admission: EM | Admit: 2023-01-02 | Discharge: 2023-01-02 | Disposition: A | Discharge status: Home / Self Care | Payer: 59 | Attending: Emergency Medicine | Admitting: Emergency Medicine

## 2023-01-02 DIAGNOSIS — M25562 Pain in left knee: Secondary | ICD-10-CM | POA: Diagnosis not present

## 2023-01-02 DIAGNOSIS — M25561 Pain in right knee: Secondary | ICD-10-CM | POA: Insufficient documentation

## 2023-01-02 DIAGNOSIS — Y92410 Unspecified street and highway as the place of occurrence of the external cause: Secondary | ICD-10-CM | POA: Insufficient documentation

## 2023-01-02 DIAGNOSIS — S0181XA Laceration without foreign body of other part of head, initial encounter: Secondary | ICD-10-CM | POA: Insufficient documentation

## 2023-01-02 LAB — BASIC METABOLIC PANEL
Anion gap: 15 (ref 5–15)
BUN: 11 mg/dL (ref 6–20)
CO2: 23 mmol/L (ref 22–32)
Calcium: 9.4 mg/dL (ref 8.9–10.3)
Chloride: 102 mmol/L (ref 98–111)
Creatinine, Ser: 1.04 mg/dL (ref 0.61–1.24)
GFR, Estimated: >60 mL/min (ref >60)
Glucose, Bld: 83 mg/dL (ref 70–99)
Potassium: 3.6 mmol/L (ref 3.5–5.1)
Sodium: 140 mmol/L (ref 135–145)

## 2023-01-02 LAB — CBC WITH DIFFERENTIAL/PLATELET
Abs Immature Granulocytes: 0.02 10*3/uL (ref 0.00–0.07)
Basophils Absolute: 0 10*3/uL (ref 0.0–0.1)
Basophils Relative: 0 %
Eosinophils Absolute: 0 10*3/uL (ref 0.0–0.5)
Eosinophils Relative: 1 %
HCT: 44.1 % (ref 39.0–52.0)
Hemoglobin: 14.5 g/dL (ref 13.0–17.0)
Immature Granulocytes: 1 %
Lymphocytes Relative: 41 %
Lymphs Abs: 1.3 10*3/uL (ref 0.7–4.0)
MCH: 29.3 pg (ref 26.0–34.0)
MCHC: 32.9 g/dL (ref 30.0–36.0)
MCV: 89.1 fL (ref 80.0–100.0)
Monocytes Absolute: 0.3 10*3/uL (ref 0.1–1.0)
Monocytes Relative: 9 %
Neutro Abs: 1.5 10*3/uL — ABNORMAL LOW (ref 1.7–7.7)
Neutrophils Relative %: 48 %
Platelets: 173 10*3/uL (ref 150–400)
RBC: 4.95 MIL/uL (ref 4.22–5.81)
RDW: 11.9 % (ref 11.5–15.5)
WBC: 3.1 10*3/uL — ABNORMAL LOW (ref 4.0–10.5)
nRBC: 0 % (ref 0.0–0.2)

## 2023-01-02 LAB — I-STAT CHEM 8, ED
BUN: 13 mg/dL (ref 6–20)
Calcium, Ion: 1.1 mmol/L — ABNORMAL LOW (ref 1.15–1.40)
Chloride: 105 mmol/L (ref 98–111)
Creatinine, Ser: 1.2 mg/dL (ref 0.61–1.24)
Glucose, Bld: 82 mg/dL (ref 70–99)
HCT: 47 % (ref 39.0–52.0)
Hemoglobin: 16 g/dL (ref 13.0–17.0)
Potassium: 3.6 mmol/L (ref 3.5–5.1)
Sodium: 142 mmol/L (ref 135–145)
TCO2: 23 mmol/L (ref 22–32)

## 2023-01-02 LAB — ETHANOL: Alcohol, Ethyl (B): 150 mg/dL — ABNORMAL HIGH (ref <10)

## 2023-01-02 MED ORDER — MECLIZINE HCL 25 MG PO TABS: 25.0000 mg | ORAL_TABLET | Freq: Three times a day (TID) | ORAL | 0 refills | Status: DC | PRN

## 2023-01-02 MED ORDER — CELECOXIB 200 MG PO CAPS: 200.0000 mg | ORAL_CAPSULE | Freq: Two times a day (BID) | ORAL | 0 refills | Status: DC

## 2023-01-02 MED ORDER — CYCLOBENZAPRINE HCL 10 MG PO TABS: 5.0000 mg | ORAL_TABLET | Freq: Two times a day (BID) | ORAL | 0 refills | Status: DC | PRN

## 2023-01-02 MED ORDER — LIDOCAINE-EPINEPHRINE (PF) 2 %-1:200000 IJ SOLN
20.0000 mL | Freq: Once | INTRAMUSCULAR | Status: AC
  Administered 2023-01-02: 20 mL
  Filled 2023-01-02: qty 20

## 2023-01-02 MED ORDER — OXYCODONE-ACETAMINOPHEN 5-325 MG PO TABS
1.0000 | ORAL_TABLET | Freq: Once | ORAL | Status: AC
  Administered 2023-01-02: 1 via ORAL
  Filled 2023-01-02: qty 1

## 2023-01-02 MED ORDER — IOHEXOL 350 MG/ML SOLN
75.0000 mL | Freq: Once | INTRAVENOUS | Status: AC | PRN
  Administered 2023-01-02: 75 mL via INTRAVENOUS
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
## 2023-01-02 NOTE — ED Notes (Signed)
Pt taken to CT at this time.
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
## 2023-01-09 ENCOUNTER — Ambulatory Visit: Payer: 59 | Admitting: Family Medicine

## 2023-01-09 VITALS — BP 142/88 | HR 77 | Ht 72.0 in | Wt 158.0 lb

## 2023-01-09 DIAGNOSIS — M542 Cervicalgia: Secondary | ICD-10-CM

## 2023-01-09 DIAGNOSIS — S0101XA Laceration without foreign body of scalp, initial encounter: Secondary | ICD-10-CM | POA: Diagnosis not present

## 2023-01-09 DIAGNOSIS — F0781 Postconcussional syndrome: Secondary | ICD-10-CM | POA: Insufficient documentation

## 2023-01-09 DIAGNOSIS — S060X9A Concussion with loss of consciousness of unspecified duration, initial encounter: Secondary | ICD-10-CM | POA: Insufficient documentation

## 2023-01-09 MED ORDER — CELECOXIB 200 MG PO CAPS: 200.0000 mg | ORAL_CAPSULE | Freq: Two times a day (BID) | ORAL | 1 refills | Status: DC | PRN

## 2023-01-09 MED ORDER — TRAZODONE HCL 50 MG PO TABS: 50.0000 mg | ORAL_TABLET | Freq: Every day | ORAL | 1 refills | Status: DC

## 2023-01-09 NOTE — Patient Instructions (Addendum)
Thank you for coming in today.   Ok to use trazodone at night for sleep.   Ok to continue celebrex for pain as needed.  OK to use celebrex with tylenol for pain as needed.   Try a heating pad on your neck.   Maybe ice for the knees.   Recheck next week. We will take the stitches out.   Plan for PT.

## 2023-01-14 ENCOUNTER — Encounter: Payer: Self-pay | Admitting: Family Medicine

## 2023-01-16 ENCOUNTER — Telehealth: Payer: Self-pay

## 2023-01-16 NOTE — Telephone Encounter (Signed)
Form received 01/12/23 Due 01/16/23

## 2023-01-16 NOTE — Telephone Encounter (Signed)
Form completed, reviewed and signed by Dr. Denyse Amass, and placed at the front desk for faxing/scanning.

## 2023-01-17 ENCOUNTER — Encounter: Payer: Self-pay | Admitting: Physical Therapy

## 2023-01-17 ENCOUNTER — Other Ambulatory Visit: Payer: Self-pay

## 2023-01-17 ENCOUNTER — Ambulatory Visit: Payer: 59 | Attending: Family Medicine | Admitting: Physical Therapy

## 2023-01-17 DIAGNOSIS — R42 Dizziness and giddiness: Secondary | ICD-10-CM | POA: Diagnosis present

## 2023-01-17 DIAGNOSIS — R2681 Unsteadiness on feet: Secondary | ICD-10-CM | POA: Diagnosis present

## 2023-01-17 DIAGNOSIS — S060X9A Concussion with loss of consciousness of unspecified duration, initial encounter: Secondary | ICD-10-CM | POA: Insufficient documentation

## 2023-01-17 DIAGNOSIS — M542 Cervicalgia: Secondary | ICD-10-CM | POA: Diagnosis not present
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
## 2023-01-18 ENCOUNTER — Ambulatory Visit: Payer: 59 | Admitting: Family Medicine

## 2023-01-18 ENCOUNTER — Ambulatory Visit: Payer: 59 | Admitting: Physical Therapy

## 2023-01-18 ENCOUNTER — Encounter: Payer: Self-pay | Admitting: Physical Therapy

## 2023-01-18 VITALS — BP 116/78 | HR 82 | Ht 72.0 in | Wt 154.0 lb

## 2023-01-18 DIAGNOSIS — S0101XD Laceration without foreign body of scalp, subsequent encounter: Secondary | ICD-10-CM

## 2023-01-18 DIAGNOSIS — S060X9D Concussion with loss of consciousness of unspecified duration, subsequent encounter: Secondary | ICD-10-CM

## 2023-01-18 DIAGNOSIS — R42 Dizziness and giddiness: Secondary | ICD-10-CM | POA: Diagnosis not present

## 2023-01-18 DIAGNOSIS — R2681 Unsteadiness on feet: Secondary | ICD-10-CM
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
## 2023-01-18 NOTE — Patient Instructions (Addendum)
Thank you for coming in today.   Plan for PT.   Let me know if you have any problems   Recheck in 1 month.
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
## 2023-01-23 ENCOUNTER — Ambulatory Visit: Payer: 59 | Attending: Family Medicine

## 2023-01-23 DIAGNOSIS — R2681 Unsteadiness on feet: Secondary | ICD-10-CM

## 2023-01-23 DIAGNOSIS — R42 Dizziness and giddiness: Secondary | ICD-10-CM
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
## 2023-01-26 ENCOUNTER — Encounter: Payer: Self-pay | Admitting: Physical Therapy

## 2023-01-26 ENCOUNTER — Ambulatory Visit: Payer: 59 | Admitting: Physical Therapy

## 2023-01-26 DIAGNOSIS — R42 Dizziness and giddiness: Secondary | ICD-10-CM

## 2023-01-26 DIAGNOSIS — R2681 Unsteadiness on feet: Secondary | ICD-10-CM
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
## 2023-01-30 ENCOUNTER — Ambulatory Visit: Payer: 59 | Admitting: Physical Therapy

## 2023-01-30 ENCOUNTER — Encounter: Payer: Self-pay | Admitting: Physical Therapy

## 2023-01-30 DIAGNOSIS — R42 Dizziness and giddiness: Secondary | ICD-10-CM
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
## 2023-01-31 ENCOUNTER — Other Ambulatory Visit: Payer: Self-pay | Admitting: Family Medicine

## 2023-02-02 ENCOUNTER — Ambulatory Visit: Payer: 59 | Admitting: Physical Therapy

## 2023-02-02 ENCOUNTER — Encounter: Payer: Self-pay | Admitting: Physical Therapy

## 2023-02-02 DIAGNOSIS — R42 Dizziness and giddiness: Secondary | ICD-10-CM

## 2023-02-02 DIAGNOSIS — R2681 Unsteadiness on feet: Secondary | ICD-10-CM
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
## 2023-02-09 ENCOUNTER — Encounter: Payer: Self-pay | Admitting: Family Medicine

## 2023-02-09 ENCOUNTER — Ambulatory Visit (INDEPENDENT_AMBULATORY_CARE_PROVIDER_SITE_OTHER): Payer: 59 | Admitting: Family Medicine

## 2023-02-09 VITALS — BP 122/86 | HR 80 | Ht 72.0 in | Wt 153.6 lb

## 2023-02-09 DIAGNOSIS — S060X9D Concussion with loss of consciousness of unspecified duration, subsequent encounter: Secondary | ICD-10-CM

## 2023-02-09 MED ORDER — CYCLOBENZAPRINE HCL 10 MG PO TABS: 5.0000 mg | ORAL_TABLET | Freq: Two times a day (BID) | ORAL | 1 refills | Status: DC | PRN

## 2023-02-09 MED ORDER — CELECOXIB 200 MG PO CAPS: 200.0000 mg | ORAL_CAPSULE | Freq: Two times a day (BID) | ORAL | 1 refills | Status: AC | PRN
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
## 2023-02-09 NOTE — Patient Instructions (Signed)
Thank you for coming in today.   Keep doing what you are doing.   Recheck in 1 month.

## 2023-02-10 ENCOUNTER — Ambulatory Visit: Payer: 59 | Admitting: Physical Therapy

## 2023-02-23 ENCOUNTER — Telehealth: Payer: Self-pay | Admitting: Family Medicine

## 2023-02-23 ENCOUNTER — Encounter: Payer: Self-pay | Admitting: Physical Therapy

## 2023-02-23 ENCOUNTER — Ambulatory Visit: Payer: 59 | Attending: Family Medicine | Admitting: Physical Therapy

## 2023-02-23 DIAGNOSIS — R2681 Unsteadiness on feet: Secondary | ICD-10-CM | POA: Insufficient documentation

## 2023-02-23 DIAGNOSIS — R42 Dizziness and giddiness: Secondary | ICD-10-CM | POA: Diagnosis present
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
## 2023-03-02 ENCOUNTER — Ambulatory Visit: Payer: 59

## 2023-03-09 ENCOUNTER — Ambulatory Visit: Payer: 59 | Admitting: Physical Therapy
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
## 2023-03-16 ENCOUNTER — Ambulatory Visit: Payer: 59 | Admitting: Family Medicine

## 2023-03-16 VITALS — BP 128/84 | HR 72 | Ht 72.0 in | Wt 153.0 lb

## 2023-03-16 DIAGNOSIS — F902 Attention-deficit hyperactivity disorder, combined type: Secondary | ICD-10-CM | POA: Diagnosis not present

## 2023-03-16 DIAGNOSIS — S060X9D Concussion with loss of consciousness of unspecified duration, subsequent encounter: Secondary | ICD-10-CM | POA: Diagnosis not present

## 2023-03-16 MED ORDER — METHYLPHENIDATE HCL ER (CD) 20 MG PO CPCR: 20.0000 mg | ORAL_CAPSULE | ORAL | 0 refills | Status: DC

## 2023-03-16 NOTE — Patient Instructions (Signed)
Thank you for coming in today.   Continue PT.   Start Medidate CD 20mg  for ADHD symptoms.   Recheck in 1 month.   OK to use darker glasses or a hat at work (if work is ok with it).   Let me know how this medicine goes. Give it a week.

## 2023-03-17 ENCOUNTER — Encounter: Payer: Self-pay | Admitting: Physical Therapy

## 2023-03-17 ENCOUNTER — Ambulatory Visit: Payer: 59 | Admitting: Physical Therapy

## 2023-03-17 DIAGNOSIS — R42 Dizziness and giddiness: Secondary | ICD-10-CM | POA: Diagnosis not present

## 2023-03-17 DIAGNOSIS — R2681 Unsteadiness on feet: Secondary | ICD-10-CM
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
## 2023-03-23 ENCOUNTER — Ambulatory Visit: Payer: 59 | Admitting: Physical Therapy

## 2023-03-30 ENCOUNTER — Ambulatory Visit: Payer: 59 | Attending: Family Medicine | Admitting: Physical Therapy

## 2023-03-30 ENCOUNTER — Encounter: Payer: Self-pay | Admitting: Physical Therapy

## 2023-03-30 DIAGNOSIS — R2681 Unsteadiness on feet: Secondary | ICD-10-CM | POA: Insufficient documentation

## 2023-03-30 DIAGNOSIS — R42 Dizziness and giddiness: Secondary | ICD-10-CM | POA: Diagnosis present
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
## 2023-04-06 ENCOUNTER — Encounter: Payer: Self-pay | Admitting: Physical Therapy

## 2023-04-06 ENCOUNTER — Ambulatory Visit: Payer: 59 | Admitting: Physical Therapy

## 2023-04-06 DIAGNOSIS — R42 Dizziness and giddiness: Secondary | ICD-10-CM

## 2023-04-06 DIAGNOSIS — R2681 Unsteadiness on feet: Secondary | ICD-10-CM
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
## 2023-04-13 ENCOUNTER — Encounter: Payer: 59 | Admitting: Family Medicine

## 2023-04-13 ENCOUNTER — Telehealth: Payer: Self-pay | Admitting: Family Medicine

## 2023-04-13 ENCOUNTER — Encounter: Payer: Self-pay | Admitting: Family Medicine

## 2023-04-13 MED ORDER — METHYLPHENIDATE HCL ER (CD) 20 MG PO CPCR: 20.0000 mg | ORAL_CAPSULE | ORAL | 0 refills | Status: DC

## 2023-04-13 NOTE — Telephone Encounter (Signed)
Troy Vasquez was scheduled to see me today 04/13/23 for follow-up concussion.  Looking over his notes he is feeling a lot better.  I called him and asked how he is feeling.  He is doing much better but still using methylphenidate.  It snowing this morning and if he is better I do not think it is necessary for him to come in.  I refilled methylphenidate and since rescheduled his visit for months from now where we can have a more helpful check in at that time.

## 2023-04-13 NOTE — Progress Notes (Deleted)
    Troy Vasquez is a 28 y.o. male who presents to [facility] at [facility] today for ***   Pertinent review of systems: ***  Relevant historical information: ***   Exam:  There were no vitals taken for this visit. General: Well Developed, well nourished, and in no acute distress.   MSK: ***    Lab and Radiology Results No results found for this or any previous visit (from the past 72 hours). No results found.     Assessment and Plan: 28 y.o. male with ***   PDMP not reviewed this encounter. No orders of the defined types were placed in this encounter.  No orders of the defined types were placed in this encounter.    Discussed warning signs or symptoms. Please see discharge instructions. Patient expresses understanding.   ***

## 2023-04-25 ENCOUNTER — Ambulatory Visit: Payer: 59 | Admitting: Physical Therapy

## 2023-04-26 ENCOUNTER — Other Ambulatory Visit: Payer: Self-pay

## 2023-04-26 ENCOUNTER — Ambulatory Visit: Payer: 59 | Attending: Physician Assistant

## 2023-04-26 DIAGNOSIS — M5459 Other low back pain: Secondary | ICD-10-CM

## 2023-04-26 DIAGNOSIS — Z8739 Personal history of other diseases of the musculoskeletal system and connective tissue: Secondary | ICD-10-CM | POA: Insufficient documentation

## 2023-04-26 DIAGNOSIS — M549 Dorsalgia, unspecified: Secondary | ICD-10-CM | POA: Diagnosis present

## 2023-04-26 DIAGNOSIS — M6281 Muscle weakness (generalized): Secondary | ICD-10-CM

## 2023-04-26 DIAGNOSIS — Q675 Congenital deformity of spine: Secondary | ICD-10-CM

## 2023-04-26 DIAGNOSIS — R252 Cramp and spasm: Secondary | ICD-10-CM

## 2023-04-26 DIAGNOSIS — R262 Difficulty in walking, not elsewhere classified: Secondary | ICD-10-CM
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
## 2023-05-03 NOTE — Telephone Encounter (Signed)
Forwarding to Dr. Corey to review.  

## 2023-05-04 ENCOUNTER — Ambulatory Visit

## 2023-05-04 DIAGNOSIS — R262 Difficulty in walking, not elsewhere classified: Secondary | ICD-10-CM

## 2023-05-04 DIAGNOSIS — R252 Cramp and spasm: Secondary | ICD-10-CM

## 2023-05-04 DIAGNOSIS — M5459 Other low back pain: Secondary | ICD-10-CM

## 2023-05-04 DIAGNOSIS — Q675 Congenital deformity of spine: Secondary | ICD-10-CM

## 2023-05-04 DIAGNOSIS — R293 Abnormal posture: Secondary | ICD-10-CM

## 2023-05-04 DIAGNOSIS — M549 Dorsalgia, unspecified: Secondary | ICD-10-CM | POA: Diagnosis not present

## 2023-05-04 DIAGNOSIS — M6281 Muscle weakness (generalized): Secondary | ICD-10-CM
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
## 2023-05-09 ENCOUNTER — Ambulatory Visit

## 2023-05-09 DIAGNOSIS — R252 Cramp and spasm: Secondary | ICD-10-CM

## 2023-05-09 DIAGNOSIS — M5459 Other low back pain: Secondary | ICD-10-CM

## 2023-05-09 DIAGNOSIS — Q675 Congenital deformity of spine: Secondary | ICD-10-CM

## 2023-05-09 DIAGNOSIS — R293 Abnormal posture: Secondary | ICD-10-CM

## 2023-05-09 DIAGNOSIS — M6281 Muscle weakness (generalized): Secondary | ICD-10-CM

## 2023-05-09 DIAGNOSIS — M549 Dorsalgia, unspecified: Secondary | ICD-10-CM | POA: Diagnosis not present
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
## 2023-05-11 ENCOUNTER — Encounter: Payer: Self-pay | Admitting: Family Medicine

## 2023-05-11 ENCOUNTER — Telehealth: Payer: Self-pay | Admitting: Family Medicine

## 2023-05-11 ENCOUNTER — Ambulatory Visit (INDEPENDENT_AMBULATORY_CARE_PROVIDER_SITE_OTHER): Payer: 59 | Admitting: Family Medicine

## 2023-05-11 VITALS — BP 142/88 | HR 75 | Ht 72.0 in

## 2023-05-11 DIAGNOSIS — F0781 Postconcussional syndrome: Secondary | ICD-10-CM

## 2023-05-11 DIAGNOSIS — F902 Attention-deficit hyperactivity disorder, combined type: Secondary | ICD-10-CM | POA: Diagnosis not present
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
## 2023-05-11 NOTE — Patient Instructions (Addendum)
 Thank you for coming in today.   Lets see how this concussion goes.   Recheck in 3 months. Let me know if you have a problem.

## 2023-05-11 NOTE — Telephone Encounter (Signed)
 Forwarding to Dr. Denyse Amass to confirm MRI order.

## 2023-05-11 NOTE — Telephone Encounter (Signed)
 Patient called and stated that he called Dr. Everlena Cooper the neurologist office and they said that they need Dr. Denyse Amass to schedule an MRI first before he goes to them. He is asking how he can set up that appointment.

## 2023-05-12 NOTE — Telephone Encounter (Signed)
 I tried to call the patient and sent a MyChart message.  If he is feeling better I do not think he needs an MRI or neurology consultation.  If he still wants an MRI happy to arrange an MRI.  Please confirm with patient.

## 2023-06-22 ENCOUNTER — Other Ambulatory Visit: Payer: Self-pay | Admitting: Family Medicine

## 2023-06-22 NOTE — Telephone Encounter (Signed)
 Last OV 05/11/23 Next OV 08/11/23  Last refill 04/13/23 Qty #90/0  Controlled substance, forwarding to Dr. Alease Hunter.

## 2023-06-23 MED ORDER — METHYLPHENIDATE HCL ER (CD) 20 MG PO CPCR: 20.0000 mg | ORAL_CAPSULE | ORAL | 0 refills | Status: DC

## 2023-06-29 ENCOUNTER — Encounter: Payer: Self-pay | Admitting: Family Medicine
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
## 2023-08-11 ENCOUNTER — Ambulatory Visit: Admitting: Family Medicine

## 2023-08-11 ENCOUNTER — Encounter: Payer: Self-pay | Admitting: Family Medicine

## 2023-08-11 VITALS — BP 134/82 | HR 66 | Ht 72.0 in | Wt 151.0 lb

## 2023-08-11 DIAGNOSIS — F0781 Postconcussional syndrome: Secondary | ICD-10-CM

## 2023-08-11 DIAGNOSIS — F902 Attention-deficit hyperactivity disorder, combined type: Secondary | ICD-10-CM

## 2023-08-11 MED ORDER — METHYLPHENIDATE HCL ER (CD) 20 MG PO CPCR: 20.0000 mg | ORAL_CAPSULE | ORAL | 0 refills | Status: DC

## 2023-08-11 NOTE — Patient Instructions (Signed)
 Thank you for coming in today.   Recheck yearly if you still need me to prescribe the ADHD medicine.   Your PCP could take this over.   I am here for you if you need me for other issues.

## 2023-11-22 ENCOUNTER — Other Ambulatory Visit: Payer: Self-pay | Admitting: Family Medicine

## 2023-11-22 NOTE — Telephone Encounter (Signed)
 Last OV 08/11/23 Next OV not scheduled  Last refill 08/11/23 Qty #90/0

## 2024-02-01 ENCOUNTER — Other Ambulatory Visit: Payer: Self-pay | Admitting: Family Medicine

## 2024-02-01 NOTE — Telephone Encounter (Signed)
 Last OV 08/11/23 Next OV not scheduled  Last refill 11/23/23 Qty #90/0

## 2024-02-28 ENCOUNTER — Encounter: Payer: Self-pay | Admitting: Family Medicine
